# Patient Record
Sex: Female | Born: 1970 | Race: Black or African American | Hispanic: No | Marital: Single
Health system: Southern US, Community
[De-identification: ages and names within clinical notes are randomized; demographics above are authoritative.]

---

## 2010-10-05 ENCOUNTER — Other Ambulatory Visit (HOSPITAL_COMMUNITY): Payer: Self-pay | Admitting: Family Medicine

## 2010-10-05 DIAGNOSIS — Z1231 Encounter for screening mammogram for malignant neoplasm of breast: Secondary | ICD-10-CM

## 2010-10-13 ENCOUNTER — Encounter (HOSPITAL_COMMUNITY): Payer: Self-pay

## 2010-10-13 ENCOUNTER — Ambulatory Visit (HOSPITAL_COMMUNITY)
Admission: RE | Admit: 2010-10-13 | Discharge: 2010-10-13 | Disposition: A | Discharge status: Home / Self Care | Payer: Self-pay | Source: Ambulatory Visit | Attending: Family Medicine | Admitting: Family Medicine

## 2010-10-13 DIAGNOSIS — Z1231 Encounter for screening mammogram for malignant neoplasm of breast: Secondary | ICD-10-CM

## 2010-10-14 ENCOUNTER — Other Ambulatory Visit: Payer: Self-pay | Admitting: Family Medicine

## 2010-10-14 DIAGNOSIS — R921 Mammographic calcification found on diagnostic imaging of breast: Secondary | ICD-10-CM

## 2010-10-20 ENCOUNTER — Ambulatory Visit
Admission: RE | Admit: 2010-10-20 | Discharge: 2010-10-20 | Disposition: A | Discharge status: Home / Self Care | Payer: Self-pay | Source: Ambulatory Visit | Attending: Family Medicine | Admitting: Family Medicine

## 2010-10-20 DIAGNOSIS — R921 Mammographic calcification found on diagnostic imaging of breast: Secondary | ICD-10-CM

## 2011-03-02 ENCOUNTER — Other Ambulatory Visit (HOSPITAL_COMMUNITY)
Admission: RE | Admit: 2011-03-02 | Discharge: 2011-03-02 | Disposition: A | Discharge status: Home / Self Care | Payer: BC Managed Care – PPO | Source: Ambulatory Visit | Attending: Family Medicine | Admitting: Family Medicine

## 2011-03-02 DIAGNOSIS — Z1159 Encounter for screening for other viral diseases: Secondary | ICD-10-CM | POA: Insufficient documentation

## 2011-03-02 DIAGNOSIS — Z124 Encounter for screening for malignant neoplasm of cervix: Secondary | ICD-10-CM | POA: Insufficient documentation

## 2011-09-13 ENCOUNTER — Other Ambulatory Visit (HOSPITAL_COMMUNITY): Payer: Self-pay | Admitting: Family Medicine

## 2011-09-13 DIAGNOSIS — Z1231 Encounter for screening mammogram for malignant neoplasm of breast: Secondary | ICD-10-CM

## 2011-10-14 ENCOUNTER — Ambulatory Visit (HOSPITAL_COMMUNITY)
Admission: RE | Admit: 2011-10-14 | Discharge: 2011-10-14 | Disposition: A | Discharge status: Home / Self Care | Payer: BC Managed Care – PPO | Source: Ambulatory Visit | Attending: Family Medicine | Admitting: Family Medicine

## 2011-10-14 DIAGNOSIS — Z1231 Encounter for screening mammogram for malignant neoplasm of breast: Secondary | ICD-10-CM

## 2012-03-28 ENCOUNTER — Other Ambulatory Visit (HOSPITAL_COMMUNITY)
Admission: RE | Admit: 2012-03-28 | Discharge: 2012-03-28 | Disposition: A | Discharge status: Home / Self Care | Payer: BC Managed Care – PPO | Source: Ambulatory Visit | Attending: Family Medicine | Admitting: Family Medicine

## 2012-03-28 DIAGNOSIS — Z1151 Encounter for screening for human papillomavirus (HPV): Secondary | ICD-10-CM | POA: Insufficient documentation

## 2012-03-28 DIAGNOSIS — Z124 Encounter for screening for malignant neoplasm of cervix: Secondary | ICD-10-CM | POA: Insufficient documentation

## 2012-10-23 ENCOUNTER — Other Ambulatory Visit (HOSPITAL_COMMUNITY): Payer: Self-pay | Admitting: Family Medicine

## 2012-10-23 DIAGNOSIS — Z1231 Encounter for screening mammogram for malignant neoplasm of breast: Secondary | ICD-10-CM

## 2012-10-30 ENCOUNTER — Ambulatory Visit (HOSPITAL_COMMUNITY)
Admission: RE | Admit: 2012-10-30 | Discharge: 2012-10-30 | Disposition: A | Discharge status: Home / Self Care | Payer: BC Managed Care – PPO | Source: Ambulatory Visit | Attending: Family Medicine | Admitting: Family Medicine

## 2012-10-30 DIAGNOSIS — Z1231 Encounter for screening mammogram for malignant neoplasm of breast: Secondary | ICD-10-CM | POA: Insufficient documentation

## 2013-12-19 ENCOUNTER — Other Ambulatory Visit (HOSPITAL_COMMUNITY): Payer: Self-pay | Admitting: Family Medicine

## 2013-12-19 DIAGNOSIS — Z1231 Encounter for screening mammogram for malignant neoplasm of breast: Secondary | ICD-10-CM

## 2013-12-24 ENCOUNTER — Ambulatory Visit (HOSPITAL_COMMUNITY): Payer: BC Managed Care – PPO

## 2013-12-25 ENCOUNTER — Ambulatory Visit (HOSPITAL_COMMUNITY)
Admission: RE | Admit: 2013-12-25 | Discharge: 2013-12-25 | Disposition: A | Discharge status: Home / Self Care | Payer: BC Managed Care – PPO | Source: Ambulatory Visit | Attending: Family Medicine | Admitting: Family Medicine

## 2013-12-25 DIAGNOSIS — Z1231 Encounter for screening mammogram for malignant neoplasm of breast: Secondary | ICD-10-CM

## 2013-12-27 ENCOUNTER — Other Ambulatory Visit: Payer: Self-pay | Admitting: Family Medicine

## 2013-12-27 DIAGNOSIS — R928 Other abnormal and inconclusive findings on diagnostic imaging of breast: Secondary | ICD-10-CM

## 2014-01-03 ENCOUNTER — Other Ambulatory Visit: Payer: BC Managed Care – PPO

## 2014-01-04 ENCOUNTER — Ambulatory Visit
Admission: RE | Admit: 2014-01-04 | Discharge: 2014-01-04 | Disposition: A | Discharge status: Home / Self Care | Payer: BC Managed Care – PPO | Source: Ambulatory Visit | Attending: Family Medicine | Admitting: Family Medicine

## 2014-01-04 DIAGNOSIS — R928 Other abnormal and inconclusive findings on diagnostic imaging of breast: Secondary | ICD-10-CM

## 2014-12-04 ENCOUNTER — Other Ambulatory Visit (HOSPITAL_COMMUNITY): Payer: Self-pay | Admitting: Family Medicine

## 2014-12-04 DIAGNOSIS — Z1231 Encounter for screening mammogram for malignant neoplasm of breast: Secondary | ICD-10-CM

## 2015-01-08 ENCOUNTER — Ambulatory Visit (HOSPITAL_COMMUNITY)
Admission: RE | Admit: 2015-01-08 | Discharge: 2015-01-08 | Disposition: A | Discharge status: Home / Self Care | Payer: BC Managed Care – PPO | Source: Ambulatory Visit | Attending: Family Medicine | Admitting: Family Medicine

## 2015-01-08 DIAGNOSIS — Z1231 Encounter for screening mammogram for malignant neoplasm of breast: Secondary | ICD-10-CM | POA: Insufficient documentation

## 2015-09-01 ENCOUNTER — Other Ambulatory Visit: Payer: Self-pay | Admitting: Family Medicine

## 2015-09-01 ENCOUNTER — Other Ambulatory Visit (HOSPITAL_COMMUNITY)
Admission: RE | Admit: 2015-09-01 | Discharge: 2015-09-01 | Disposition: A | Discharge status: Home / Self Care | Payer: BC Managed Care – PPO | Source: Ambulatory Visit | Attending: Family Medicine | Admitting: Family Medicine

## 2015-09-01 DIAGNOSIS — Z1151 Encounter for screening for human papillomavirus (HPV): Secondary | ICD-10-CM | POA: Insufficient documentation

## 2015-09-01 DIAGNOSIS — Z01411 Encounter for gynecological examination (general) (routine) with abnormal findings: Secondary | ICD-10-CM | POA: Insufficient documentation

## 2015-09-03 LAB — CYTOLOGY - PAP

## 2015-12-16 ENCOUNTER — Other Ambulatory Visit: Payer: Self-pay | Admitting: Family Medicine

## 2015-12-16 DIAGNOSIS — Z1231 Encounter for screening mammogram for malignant neoplasm of breast: Secondary | ICD-10-CM

## 2016-01-09 ENCOUNTER — Ambulatory Visit
Admission: RE | Admit: 2016-01-09 | Discharge: 2016-01-09 | Disposition: A | Discharge status: Home / Self Care | Payer: BC Managed Care – PPO | Source: Ambulatory Visit | Attending: Family Medicine | Admitting: Family Medicine

## 2016-01-09 ENCOUNTER — Ambulatory Visit: Payer: BC Managed Care – PPO

## 2016-01-09 DIAGNOSIS — Z1231 Encounter for screening mammogram for malignant neoplasm of breast: Secondary | ICD-10-CM

## 2016-01-12 ENCOUNTER — Ambulatory Visit: Payer: BC Managed Care – PPO

## 2016-09-15 ENCOUNTER — Other Ambulatory Visit: Payer: Self-pay | Admitting: Family Medicine

## 2016-09-15 ENCOUNTER — Other Ambulatory Visit (HOSPITAL_COMMUNITY)
Admission: RE | Admit: 2016-09-15 | Discharge: 2016-09-15 | Disposition: A | Discharge status: Home / Self Care | Payer: BC Managed Care – PPO | Source: Ambulatory Visit | Attending: Family Medicine | Admitting: Family Medicine

## 2016-09-15 DIAGNOSIS — Z124 Encounter for screening for malignant neoplasm of cervix: Secondary | ICD-10-CM | POA: Diagnosis present

## 2016-09-17 LAB — CYTOLOGY - PAP
Diagnosis: NEGATIVE
HPV: NOT DETECTED

## 2016-12-03 ENCOUNTER — Other Ambulatory Visit: Payer: Self-pay | Admitting: Family Medicine

## 2016-12-03 DIAGNOSIS — Z1231 Encounter for screening mammogram for malignant neoplasm of breast: Secondary | ICD-10-CM

## 2017-01-13 ENCOUNTER — Ambulatory Visit
Admission: RE | Admit: 2017-01-13 | Discharge: 2017-01-13 | Disposition: A | Discharge status: Home / Self Care | Payer: BC Managed Care – PPO | Source: Ambulatory Visit | Attending: Family Medicine | Admitting: Family Medicine

## 2017-01-13 DIAGNOSIS — Z1231 Encounter for screening mammogram for malignant neoplasm of breast: Secondary | ICD-10-CM

## 2017-12-06 ENCOUNTER — Other Ambulatory Visit: Payer: Self-pay | Admitting: Family Medicine

## 2017-12-06 DIAGNOSIS — Z1231 Encounter for screening mammogram for malignant neoplasm of breast: Secondary | ICD-10-CM

## 2018-01-16 ENCOUNTER — Ambulatory Visit
Admission: RE | Admit: 2018-01-16 | Discharge: 2018-01-16 | Disposition: A | Discharge status: Home / Self Care | Payer: BC Managed Care – PPO | Source: Ambulatory Visit | Attending: Family Medicine | Admitting: Family Medicine

## 2018-01-16 DIAGNOSIS — Z1231 Encounter for screening mammogram for malignant neoplasm of breast: Secondary | ICD-10-CM

## 2018-12-07 ENCOUNTER — Other Ambulatory Visit: Payer: Self-pay | Admitting: Family Medicine

## 2018-12-07 DIAGNOSIS — Z1231 Encounter for screening mammogram for malignant neoplasm of breast: Secondary | ICD-10-CM

## 2019-01-19 ENCOUNTER — Ambulatory Visit
Admission: RE | Admit: 2019-01-19 | Discharge: 2019-01-19 | Disposition: A | Discharge status: Home / Self Care | Payer: BC Managed Care – PPO | Source: Ambulatory Visit | Attending: Family Medicine | Admitting: Family Medicine

## 2019-01-19 ENCOUNTER — Other Ambulatory Visit: Payer: Self-pay

## 2019-01-19 DIAGNOSIS — Z1231 Encounter for screening mammogram for malignant neoplasm of breast: Secondary | ICD-10-CM

## 2019-07-16 ENCOUNTER — Ambulatory Visit: Payer: BC Managed Care – PPO | Attending: Internal Medicine

## 2019-07-16 DIAGNOSIS — Z20822 Contact with and (suspected) exposure to covid-19: Secondary | ICD-10-CM

## 2019-07-18 LAB — NOVEL CORONAVIRUS, NAA: SARS-CoV-2, NAA: NOT DETECTED

## 2019-12-25 ENCOUNTER — Other Ambulatory Visit: Payer: Self-pay | Admitting: Family Medicine

## 2019-12-25 DIAGNOSIS — Z1231 Encounter for screening mammogram for malignant neoplasm of breast: Secondary | ICD-10-CM

## 2020-01-21 ENCOUNTER — Other Ambulatory Visit: Payer: Self-pay

## 2020-01-21 ENCOUNTER — Ambulatory Visit
Admission: RE | Admit: 2020-01-21 | Discharge: 2020-01-21 | Disposition: A | Discharge status: Home / Self Care | Payer: BC Managed Care – PPO | Source: Ambulatory Visit | Attending: Family Medicine | Admitting: Family Medicine

## 2020-01-21 ENCOUNTER — Ambulatory Visit: Payer: BC Managed Care – PPO

## 2020-01-21 DIAGNOSIS — Z1231 Encounter for screening mammogram for malignant neoplasm of breast: Secondary | ICD-10-CM

## 2020-01-23 ENCOUNTER — Other Ambulatory Visit (HOSPITAL_COMMUNITY)
Admission: RE | Admit: 2020-01-23 | Discharge: 2020-01-23 | Disposition: A | Discharge status: Home / Self Care | Payer: BC Managed Care – PPO | Source: Ambulatory Visit | Attending: Family Medicine | Admitting: Family Medicine

## 2020-01-23 DIAGNOSIS — Z124 Encounter for screening for malignant neoplasm of cervix: Secondary | ICD-10-CM | POA: Insufficient documentation

## 2020-01-25 LAB — CYTOLOGY - PAP
Comment: NEGATIVE
Diagnosis: NEGATIVE
High risk HPV: NEGATIVE

## 2020-02-14 ENCOUNTER — Ambulatory Visit: Payer: Self-pay

## 2020-02-14 ENCOUNTER — Other Ambulatory Visit: Payer: Self-pay

## 2020-02-14 ENCOUNTER — Other Ambulatory Visit: Payer: Self-pay | Admitting: Family Medicine

## 2020-02-14 DIAGNOSIS — M25531 Pain in right wrist: Secondary | ICD-10-CM

## 2020-12-10 ENCOUNTER — Other Ambulatory Visit: Payer: Self-pay | Admitting: Family Medicine

## 2020-12-10 DIAGNOSIS — Z1231 Encounter for screening mammogram for malignant neoplasm of breast: Secondary | ICD-10-CM

## 2021-02-02 ENCOUNTER — Other Ambulatory Visit: Payer: Self-pay

## 2021-02-02 ENCOUNTER — Ambulatory Visit
Admission: RE | Admit: 2021-02-02 | Discharge: 2021-02-02 | Disposition: A | Discharge status: Home / Self Care | Payer: BC Managed Care – PPO | Source: Ambulatory Visit | Attending: Family Medicine | Admitting: Family Medicine

## 2021-02-02 DIAGNOSIS — Z1231 Encounter for screening mammogram for malignant neoplasm of breast: Secondary | ICD-10-CM

## 2021-10-09 ENCOUNTER — Ambulatory Visit
Admission: RE | Admit: 2021-10-09 | Discharge: 2021-10-09 | Disposition: A | Discharge status: Home / Self Care | Payer: BC Managed Care – PPO | Source: Ambulatory Visit | Attending: Home Modifications | Admitting: Home Modifications

## 2021-10-09 ENCOUNTER — Other Ambulatory Visit: Payer: Self-pay | Admitting: Home Modifications

## 2021-10-09 DIAGNOSIS — R222 Localized swelling, mass and lump, trunk: Secondary | ICD-10-CM

## 2021-11-26 ENCOUNTER — Other Ambulatory Visit: Payer: Self-pay | Admitting: Family Medicine

## 2021-11-26 ENCOUNTER — Ambulatory Visit
Admission: RE | Admit: 2021-11-26 | Discharge: 2021-11-26 | Disposition: A | Discharge status: Home / Self Care | Payer: BC Managed Care – PPO | Source: Ambulatory Visit | Attending: Family Medicine | Admitting: Family Medicine

## 2021-11-26 DIAGNOSIS — R52 Pain, unspecified: Secondary | ICD-10-CM

## 2022-01-07 ENCOUNTER — Other Ambulatory Visit: Payer: Self-pay | Admitting: Family Medicine

## 2022-01-07 DIAGNOSIS — Z1231 Encounter for screening mammogram for malignant neoplasm of breast: Secondary | ICD-10-CM

## 2022-02-05 ENCOUNTER — Ambulatory Visit
Admission: RE | Admit: 2022-02-05 | Discharge: 2022-02-05 | Disposition: A | Discharge status: Home / Self Care | Payer: BC Managed Care – PPO | Source: Ambulatory Visit | Attending: Family Medicine | Admitting: Family Medicine

## 2022-02-05 DIAGNOSIS — Z1231 Encounter for screening mammogram for malignant neoplasm of breast: Secondary | ICD-10-CM

## 2022-11-18 IMAGING — MG MM DIGITAL SCREENING BILAT W/ TOMO AND CAD
8 series · 8 of 24 positions shown · non-contrast
Comparison: Previous exam(s).

CLINICAL DATA: Screening.

EXAM:
DIGITAL SCREENING BILATERAL MAMMOGRAM WITH TOMOSYNTHESIS AND CAD
TECHNIQUE: Bilateral screening digital craniocaudal and mediolateral oblique
mammograms were obtained. Bilateral screening digital breast
tomosynthesis was performed. The images were evaluated with
computer-aided detection.

[R CC synth-2D]
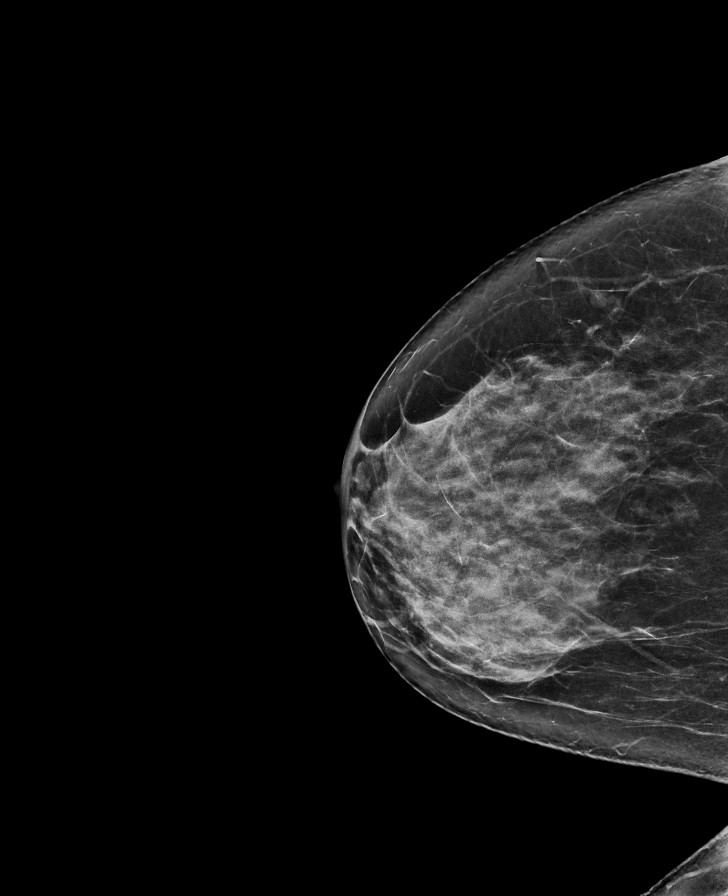

[R MLO synth-2D]
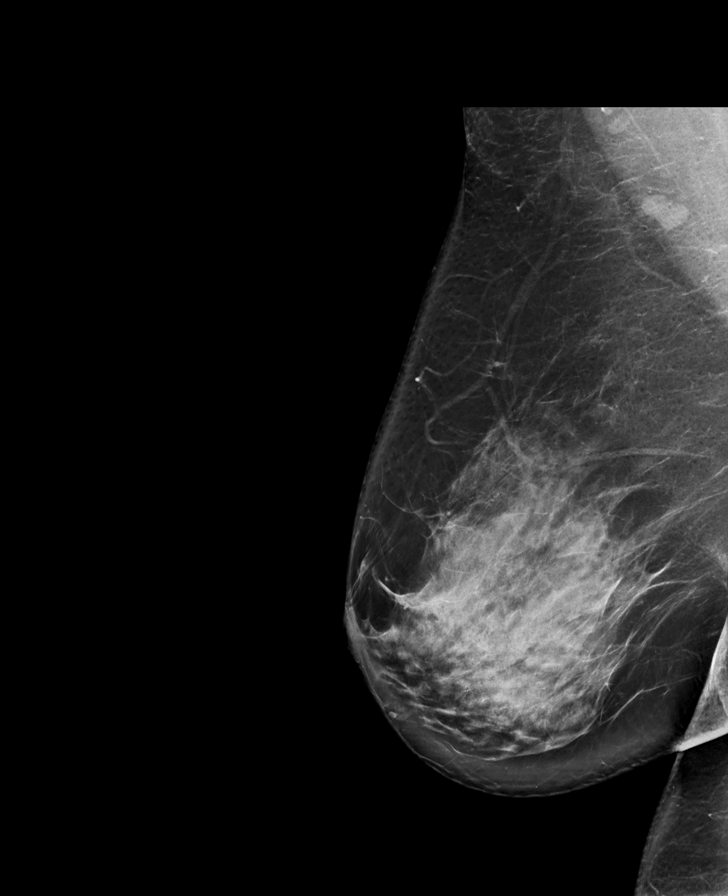

[L CC synth-2D]
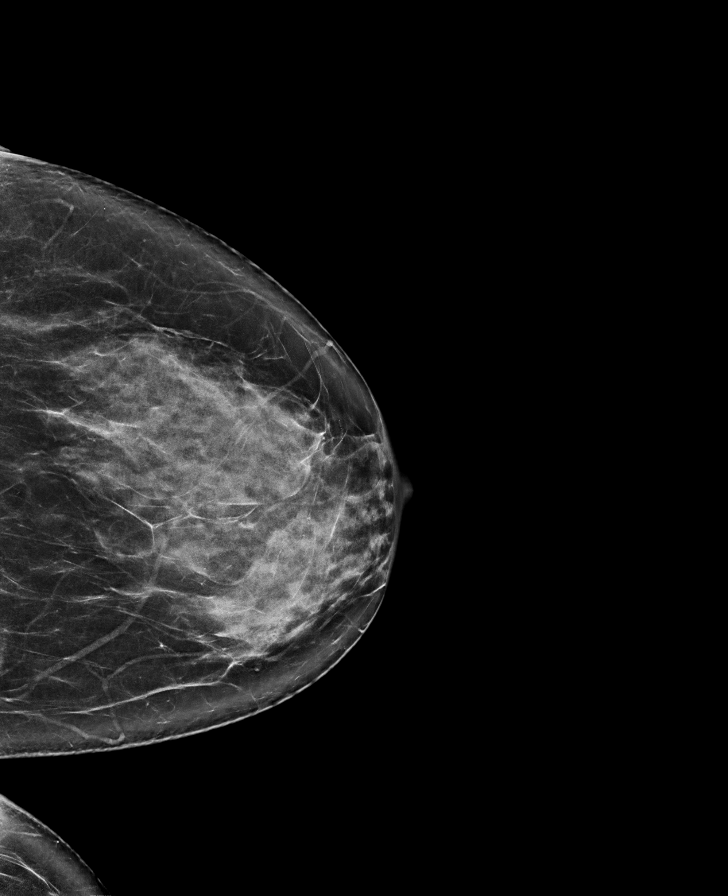

[L MLO synth-2D]
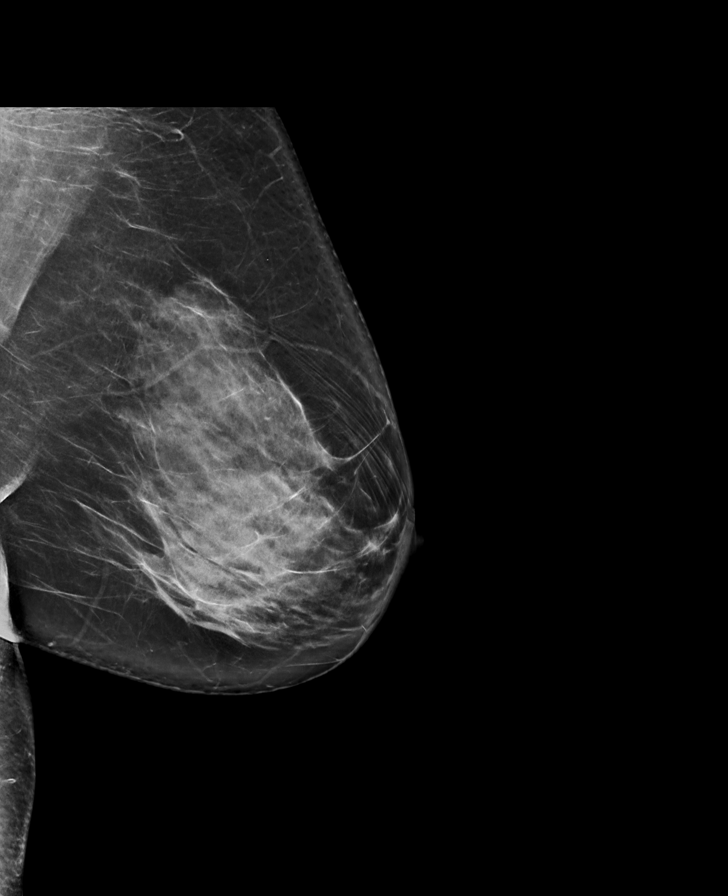

[L MLO tomo · tomo slice 46/91.0]
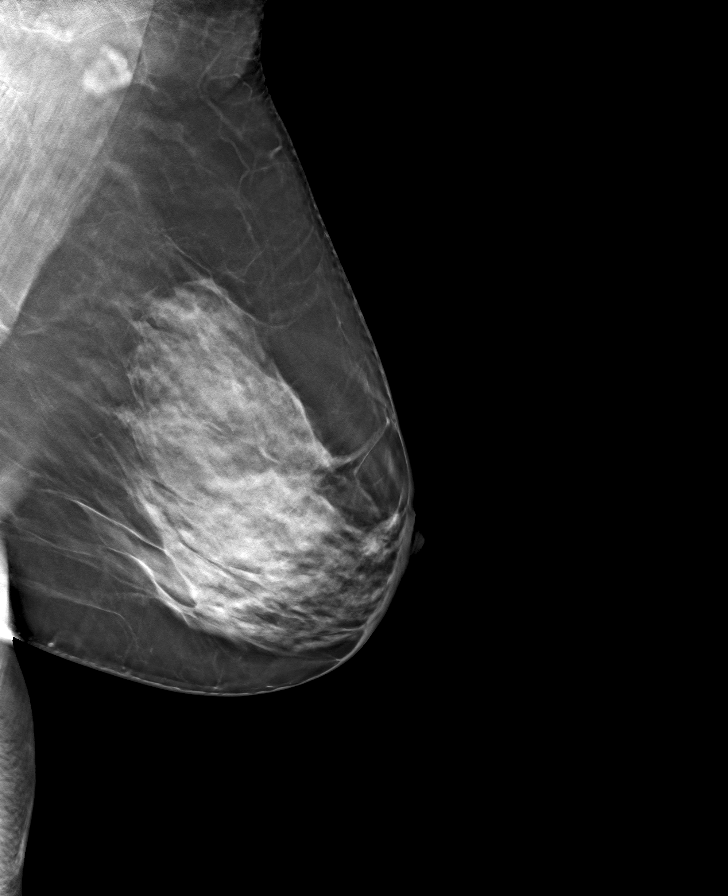

[R MLO tomo · tomo slice 51/102.0]
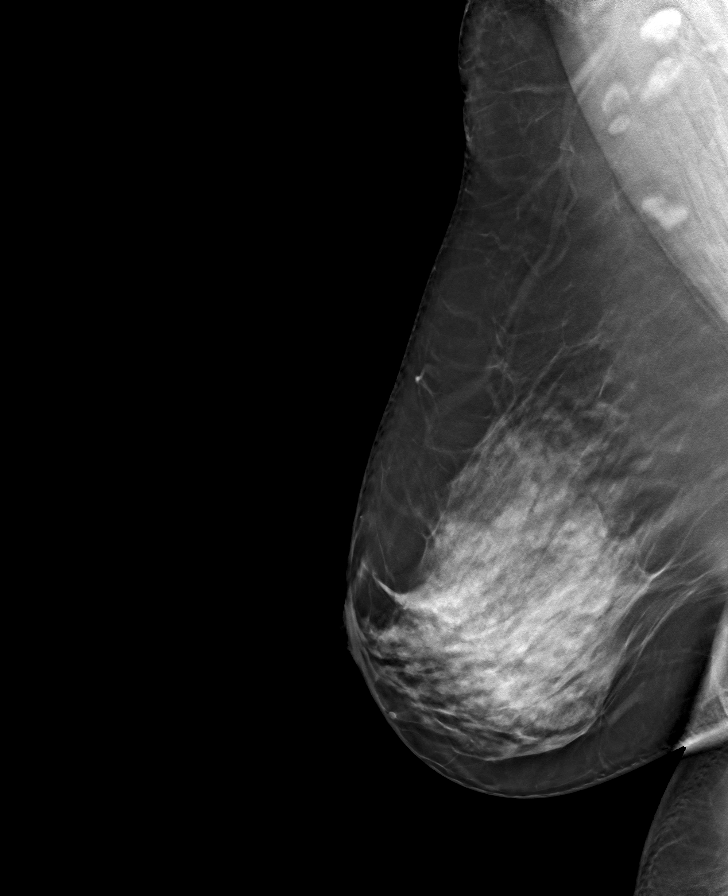

[L CC tomo · tomo slice 39/78.0]
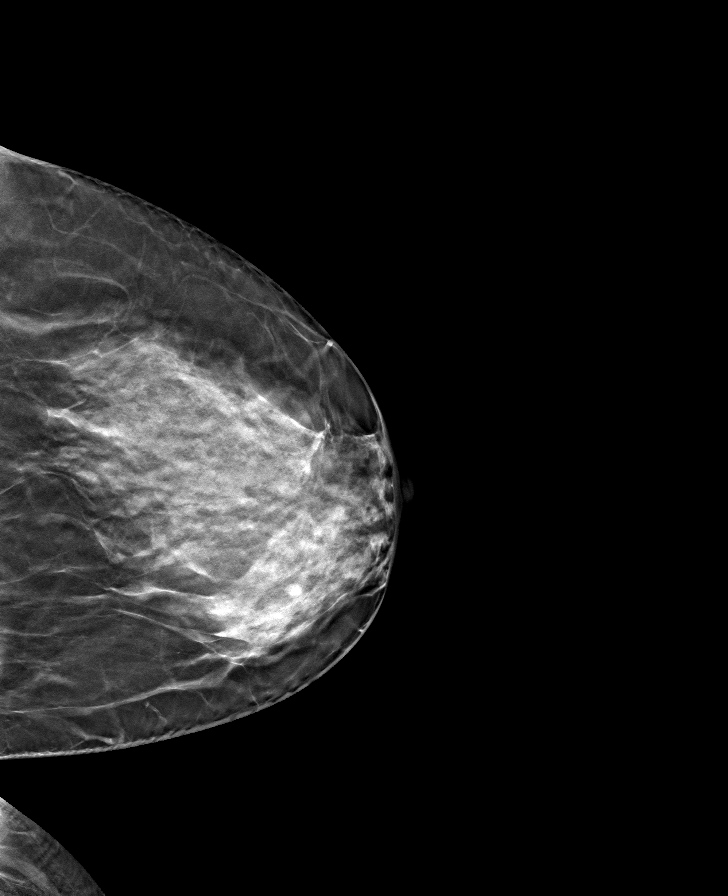

[R CC tomo · tomo slice 39/78.0]
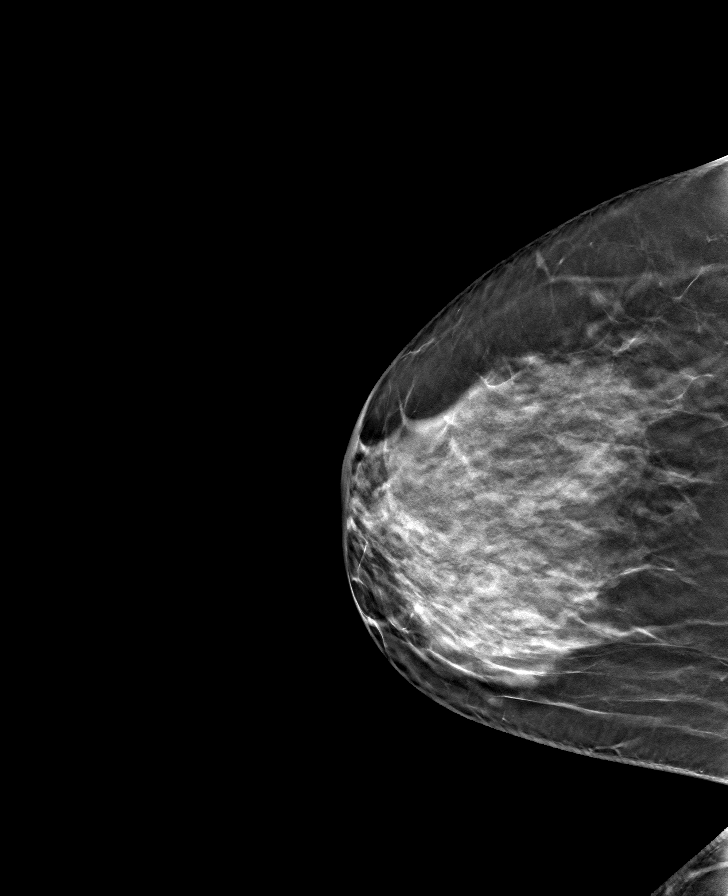

[8 of 24 positions shown; findings below may reference images not displayed]

ACR Breast Density Category d: The breast tissue is extremely dense,
which lowers the sensitivity of mammography
FINDINGS: There are no findings suspicious for malignancy.
IMPRESSION: No mammographic evidence of malignancy. A result letter of this
screening mammogram will be mailed directly to the patient.

RECOMMENDATION:
Screening mammogram in one year. (Code:TA-V-WV9)

BI-RADS CATEGORY  1: Negative.

## 2022-12-24 ENCOUNTER — Encounter: Payer: Self-pay | Admitting: Family Medicine

## 2022-12-24 DIAGNOSIS — Z1231 Encounter for screening mammogram for malignant neoplasm of breast: Secondary | ICD-10-CM

## 2023-01-06 DIAGNOSIS — Z1231 Encounter for screening mammogram for malignant neoplasm of breast: Secondary | ICD-10-CM

## 2023-03-01 ENCOUNTER — Encounter: Payer: Self-pay | Admitting: Family Medicine

## 2023-03-01 DIAGNOSIS — R0789 Other chest pain: Secondary | ICD-10-CM

## 2023-03-04 ENCOUNTER — Encounter: Payer: Self-pay | Admitting: Family Medicine

## 2023-03-04 DIAGNOSIS — R0789 Other chest pain: Secondary | ICD-10-CM

## 2023-03-07 ENCOUNTER — Other Ambulatory Visit: Payer: Self-pay | Admitting: Family Medicine

## 2023-03-07 DIAGNOSIS — N644 Mastodynia: Secondary | ICD-10-CM

## 2023-03-24 ENCOUNTER — Ambulatory Visit: Payer: BC Managed Care – PPO

## 2023-03-24 ENCOUNTER — Ambulatory Visit
Admission: RE | Admit: 2023-03-24 | Discharge: 2023-03-24 | Disposition: A | Discharge status: Home / Self Care | Payer: BC Managed Care – PPO | Source: Ambulatory Visit | Attending: Family Medicine | Admitting: Family Medicine

## 2023-03-24 DIAGNOSIS — N644 Mastodynia: Secondary | ICD-10-CM

## 2023-06-03 ENCOUNTER — Other Ambulatory Visit: Payer: Self-pay | Admitting: Family Medicine

## 2023-06-03 DIAGNOSIS — R0789 Other chest pain: Secondary | ICD-10-CM

## 2023-06-15 ENCOUNTER — Ambulatory Visit
Admission: RE | Admit: 2023-06-15 | Discharge: 2023-06-15 | Disposition: A | Discharge status: Home / Self Care | Payer: 59 | Source: Ambulatory Visit | Attending: Family Medicine | Admitting: Family Medicine

## 2023-06-15 DIAGNOSIS — R0789 Other chest pain: Secondary | ICD-10-CM

## 2023-06-15 MED ORDER — IOPAMIDOL (ISOVUE-300) INJECTION 61%
75.0000 mL | Freq: Once | INTRAVENOUS | Status: AC | PRN
  Administered 2023-06-15: 75 mL via INTRAVENOUS

## 2023-07-25 IMAGING — US US SOFT TISSUE
1 series · 14 of 16 positions shown · non-contrast
Comparison: None Available.

CLINICAL DATA: Left chest wall nodule for 1 year, tender to
palpation

EXAM:
ULTRASOUND OF CHEST SOFT TISSUES
TECHNIQUE: Ultrasound examination of the chest wall soft tissues was performed
in the area of clinical concern.

[Series 1: us soft tissue · 0.09mm/px · 14 of 22 slices shown]
[im 1/22]
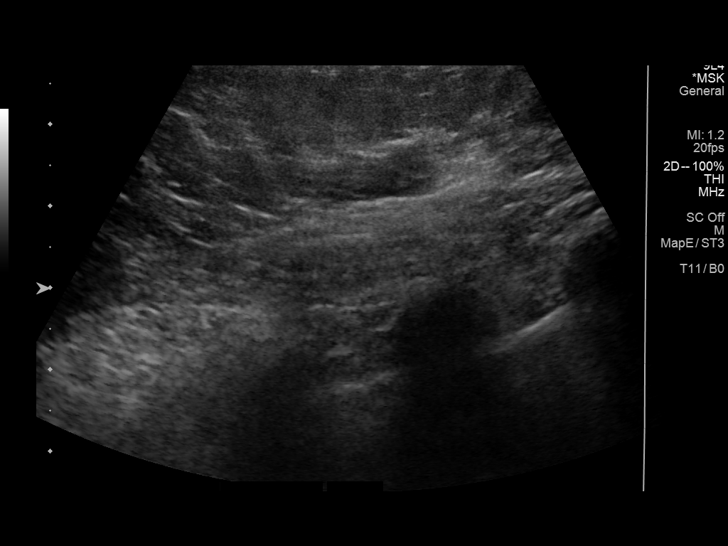
[im 2/22]
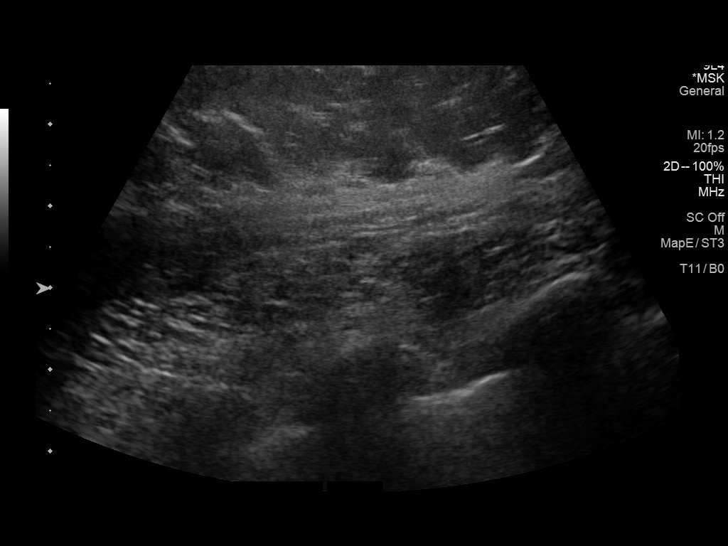
[im 3/22]
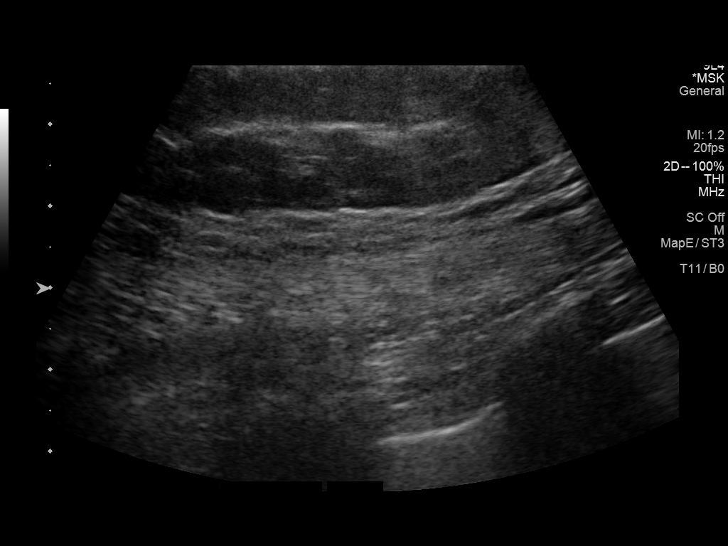
[im 6/22]
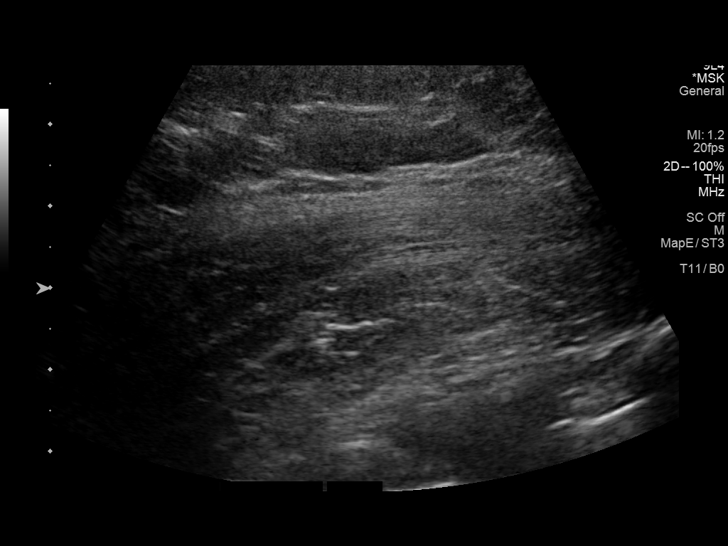
[im 8/22]
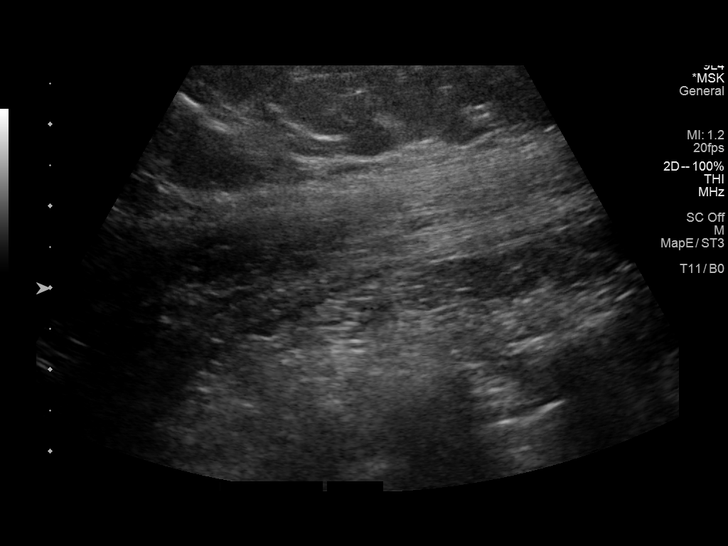
[im 9/22]
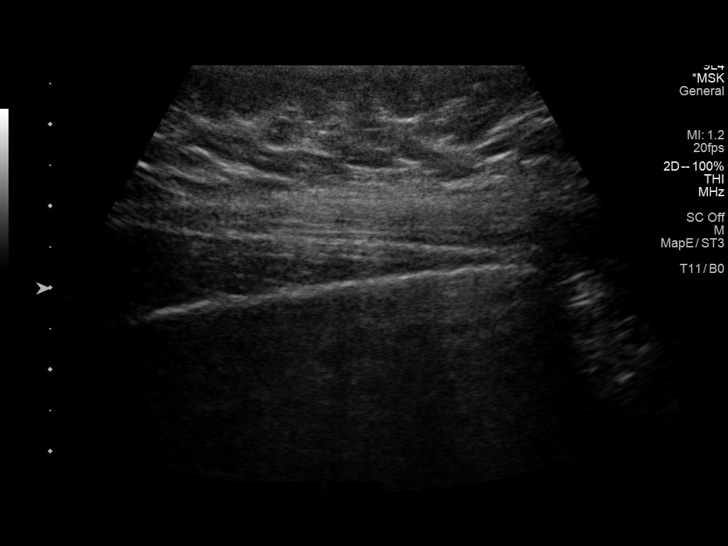
[im 10/22]
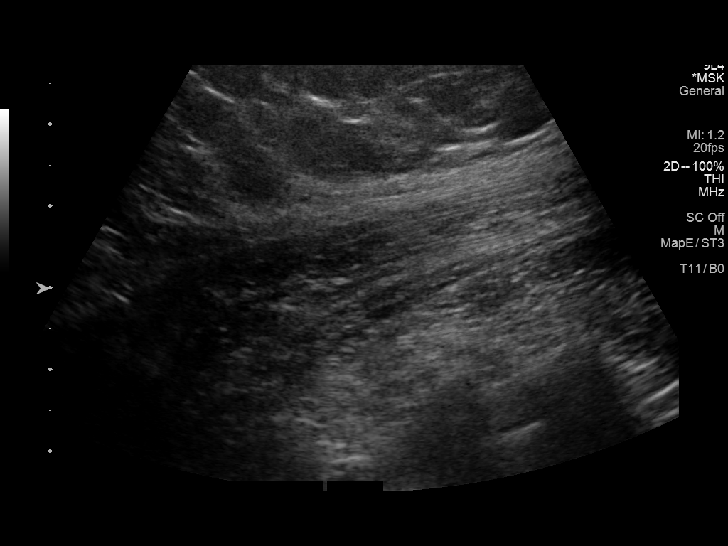
[im 12/22]
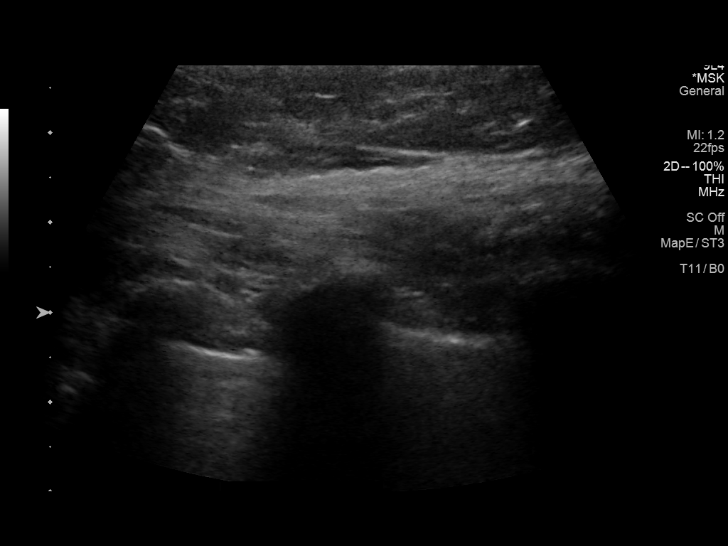
[im 13/22]
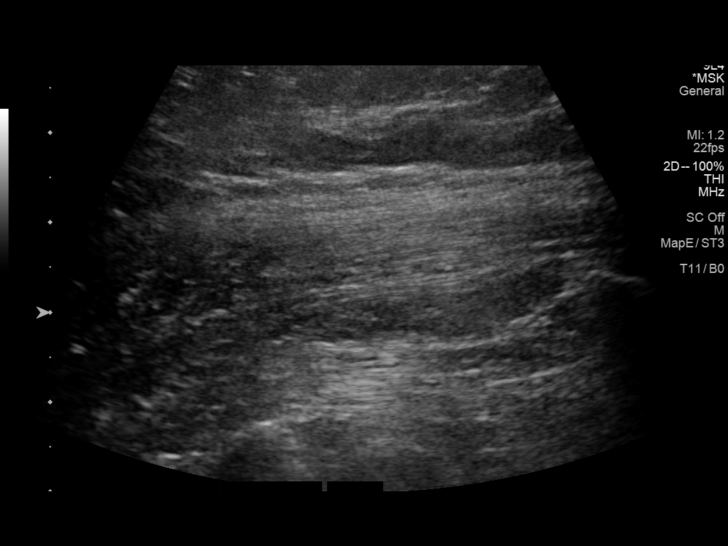
[im 15/22]
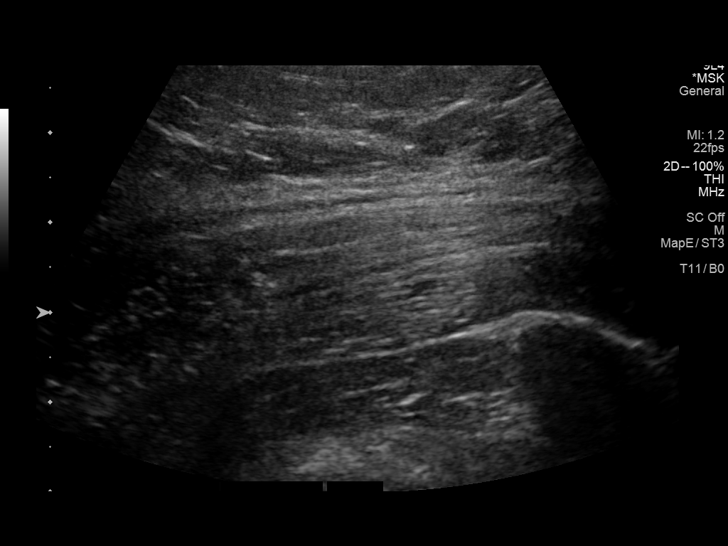
[im 17/22]
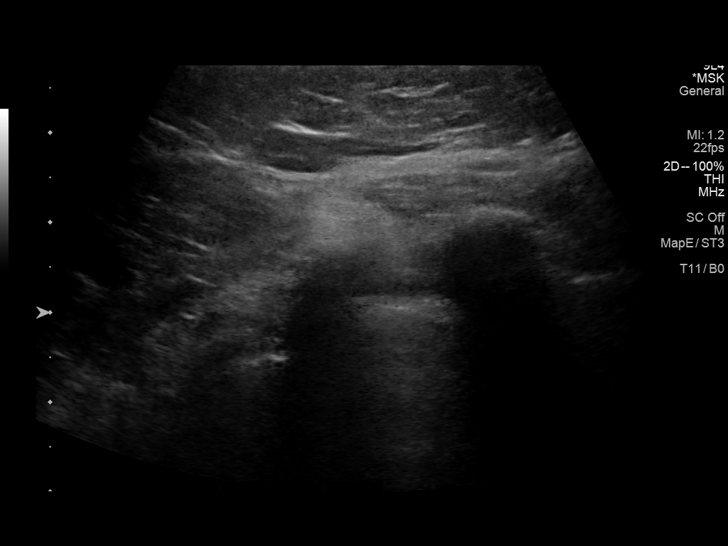
[im 19/22]
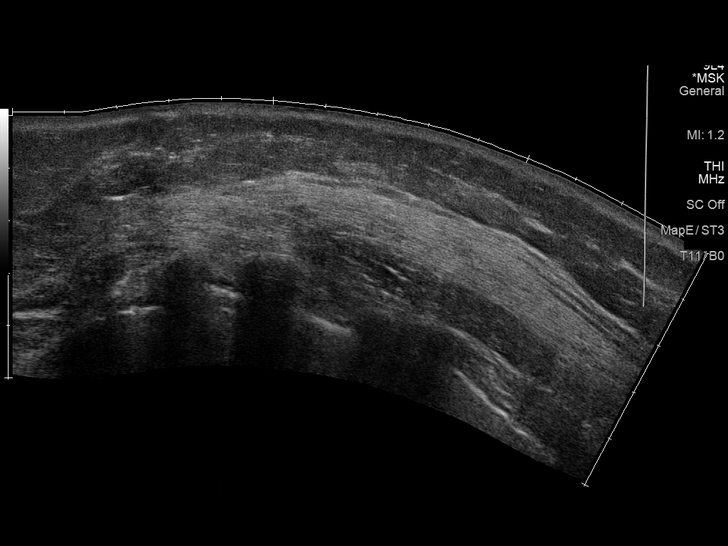
[im 20/22]
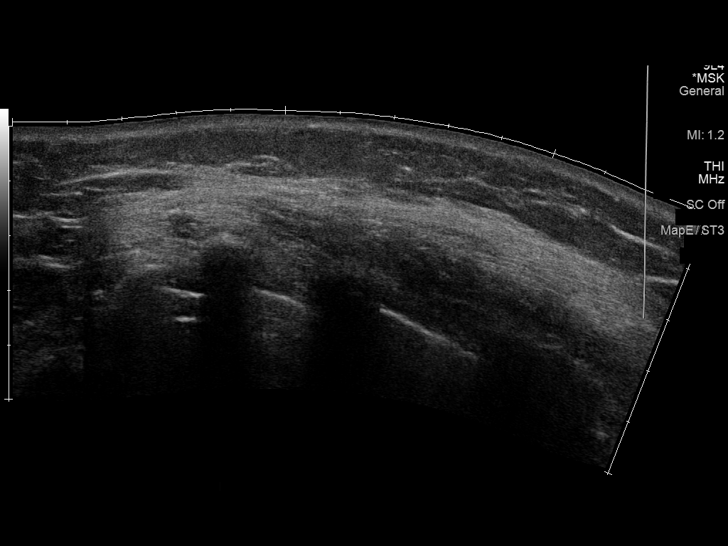
[im 22/22]
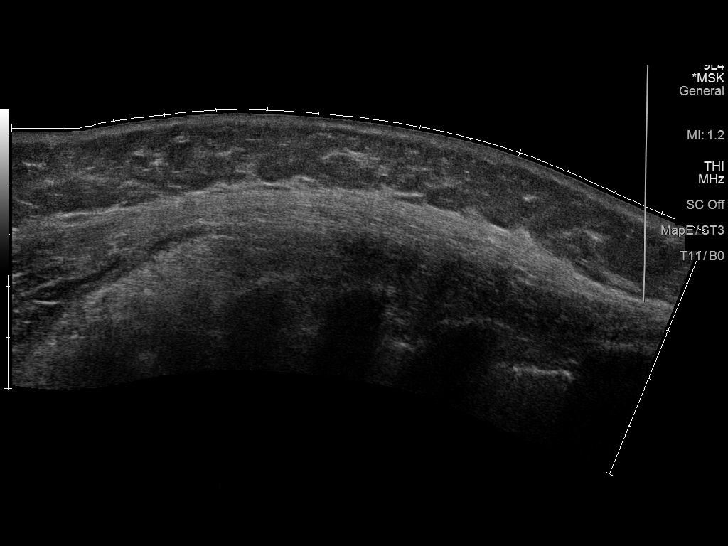

[14 of 16 positions shown; findings below may reference images not displayed]

FINDINGS: Sonographic evaluation of the area of tenderness in the left lateral
chest was performed. Normal subcutaneous tissues are identified. No
masses or fluid collections.
IMPRESSION: 1. No sonographic findings to correspond to the reported tender
palpable abnormality.

## 2024-02-24 ENCOUNTER — Other Ambulatory Visit: Payer: Self-pay | Admitting: Family Medicine

## 2024-02-24 DIAGNOSIS — Z1231 Encounter for screening mammogram for malignant neoplasm of breast: Secondary | ICD-10-CM

## 2024-03-26 ENCOUNTER — Ambulatory Visit
Admission: RE | Admit: 2024-03-26 | Discharge: 2024-03-26 | Disposition: A | Discharge status: Home / Self Care | Source: Ambulatory Visit | Attending: Family Medicine | Admitting: Family Medicine

## 2024-03-26 ENCOUNTER — Ambulatory Visit

## 2024-03-26 DIAGNOSIS — Z1231 Encounter for screening mammogram for malignant neoplasm of breast: Secondary | ICD-10-CM

## 2024-05-03 NOTE — Progress Notes (Unsigned)
 Medical Nutrition Therapy  Appointment Start time:  1600  Appointment End time:  1710  Primary concerns today: preventing progression to overt diabetes, promote weight reductions to a lower BMI range Referral diagnosis: Prediabetes  Preferred learning style: no preference indicated Learning readiness: contemplating, ready, change in progress   NUTRITION ASSESSMENT  Clinical Medical Hx: prediabetes Medications: magnesium oxide 241 mg daily, mobic 15 mg dail, MVI with folic acid 400 mcg daily, Valtrex PRN  Labs: A1C obtained 04/23/2024: 6% per referring provider Lipids obtained 12/1/20225 per referring provider:  Total cholesterol: 209, HDL 56, LDL 129, TG 125   Notable Signs/Symptoms:  Wt Readings from Last 3 Encounters:  05/04/24 206 lb 12.8 oz (93.8 kg)   Lifestyle & Dietary Hx Pt presents today alone for initial assessment. Pt reports she is working full time combined walking and sitting. Pt reports she lives alone and does the cooking and shopping. Pt reports some foods like dairy affects me stating suspected lactose intolerance selecting oat or almond milk. Pt reports decreasing snack intake, increasing water intake and decreasing portions and decreasing juice intake. Pt reports her appetite is controlled. Pt reports eating out twice weekly. Pt reports typical intake of 2-3 meals daily stating on the weekends breakfast and lunch combined to brunch.  Pt reports she has a fear of choosing the wrong foods and was encouraged to discuss further with therapy. All Pt's questions were answered during this encounter.    Estimated daily fluid intake: 64+ oz Supplements: Multi Vitamin, Probiotics, Bi-Flex. Sleep: hour on average 7-8  Stress / self-care: 5-7 out of 10 / self care includes: prayer, devotional, journal, therapy Current average weekly physical activity: walking 1-3 days weekly for 40 minutes or [employer]  24-Hr Dietary Recall First Meal: skips or 1 slice of whole grain bread with  peanut butter or coconut yogurt, fruit, decafe coffee with nut pods non dairy creamer, stevia or ~ 4 oz orange juice  Snack: none Second Meal: egg whites, 2 slices whole wheat, avocado or bagel with egg whites, chicken sausage or leftovers or tuna with handful pita chips sometime mayo, water Snack: none or cookies or popcorn  Third Meal: 6 wings, fries or chic fa la, sprite or stir fried or pan fried chicken or salmon, sweet pototes, salad with dressing or green beans, water or juice  Snack: none or cookies or popcorn Beverages: decafe with nut pods non dairy creamer, stevia, orange juice 4/d/w, water  NUTRITION DIAGNOSIS  NB-1.1 Food and nutrition-related knowledge deficit As related to no prior nutrition related education .  As evidenced by Pt reports and dietary recall.  NUTRITION INTERVENTION  Nutrition education (E-1) on the following topics:  Fruits & Vegetables: Aim to fill half your plate with a variety of fruits and vegetables. They are rich in vitamins, minerals, and fiber, and can help reduce the risk of chronic diseases. Choose a colorful assortment of fruits and vegetables to ensure you get a wide range of nutrients. Grains and Starches: Make at least half of your grain choices whole grains, such as brown rice, whole wheat bread, and oats. Whole grains provide fiber, which aids in digestion and healthy cholesterol levels. Aim for whole forms of starchy vegetables such as potatoes, sweet potatoes, beans, peas, and corn, which are fiber rich and provide many vitamins and minerals.  Protein: Incorporate lean sources of protein, such as poultry, fish, beans, nuts, and seeds, into your meals. Protein is essential for building and repairing tissues, staying full, balancing blood sugar,  as well as supporting immune function. Dairy: Include low-fat or fat-free dairy products like milk, yogurt, and cheese in your diet. Dairy foods are excellent sources of calcium and vitamin D, which are crucial  for bone health.  Physical Activity: Aim for 60 minutes of physical activity daily. Regular physical activity promotes overall health-including helping to reduce risk for heart disease and diabetes, promoting mental health, and helping us  sleep better.   Handouts Provided Include  Move Your Way-[facility] Plate Planner-[employer]al [employer]  Learning Style & Readiness for Change Teaching method utilized: Visual & Auditory  Demonstrated degree of understanding via: Teach Back  Barriers to learning/adherence to lifestyle change: unknown   Goals Established by Pt Omit juice and sugary sweetened beverages    MONITORING & EVALUATION Dietary intake, weekly physical activity  Next Steps  Patient is to return PRN.

## 2024-05-04 ENCOUNTER — Encounter: Admitting: Dietician

## 2024-05-04 VITALS — Wt 206.8 lb

## 2024-05-04 DIAGNOSIS — R7303 Prediabetes: Secondary | ICD-10-CM
